# Patient Record
Sex: Female | Born: 1975 | Race: White | Hispanic: No | Marital: Married | Smoking: Current every day smoker
Health system: Southern US, Community
[De-identification: ages and names within clinical notes are randomized; demographics above are authoritative.]

## PROBLEM LIST (undated history)

## (undated) DIAGNOSIS — Z789 Other specified health status: Secondary | ICD-10-CM

## (undated) DIAGNOSIS — L02429 Furuncle of limb, unspecified: Secondary | ICD-10-CM

## (undated) HISTORY — PX: THYROID LOBECTOMY: SHX420

---

## 2015-12-16 ENCOUNTER — Ambulatory Visit: Payer: Self-pay | Admitting: Physician Assistant

## 2015-12-19 NOTE — Pre-Procedure Instructions (Signed)
    [name]  12/19/2015    Your procedure is scheduled on Thursday May 11.  [facility]  at 1:00 PM .                 .Your surgery or procedure is scheduled for 3:00 PM   Call this number if you have problems the morning of [phone]                For any other questions, please call [phone], Monday - Friday 8 AM - 4 PM.   Remember:  Do not eat food or drink liquids after midnight Wednesday, May 10.  Take these medicines the morning of surgery with A SIP OF WATER : Take if needed: Tramadol.             STOP taking Meloxicam.    Do not wear jewelry, make-up or nail polish.  Do not wear lotions, powders, or perfumes.   Do not shave 48 hours prior to surgery.    Do not bring valuables to the hospital.  [facility] is not responsible for any belongings or valuables.  Contacts, dentures or bridgework may not be worn into surgery.  Leave your suitcase in the car.  After surgery it may be brought to your room.  For patients admitted to the hospital, discharge time will be determined by your treatment team.     Please read over the following fact sheets that you were given.:  Review  Alexandra Reese - Preparing For Surgery.

## 2015-12-20 ENCOUNTER — Encounter (HOSPITAL_COMMUNITY): Payer: Self-pay

## 2015-12-20 ENCOUNTER — Encounter (HOSPITAL_COMMUNITY)
Admission: RE | Admit: 2015-12-20 | Discharge: 2015-12-20 | Disposition: A | Discharge status: Home / Self Care | Payer: Managed Care, Other (non HMO) | Source: Ambulatory Visit | Attending: Orthopedic Surgery | Admitting: Orthopedic Surgery

## 2015-12-20 DIAGNOSIS — Z79899 Other long term (current) drug therapy: Secondary | ICD-10-CM | POA: Diagnosis not present

## 2015-12-20 DIAGNOSIS — Z01812 Encounter for preprocedural laboratory examination: Secondary | ICD-10-CM | POA: Insufficient documentation

## 2015-12-20 DIAGNOSIS — Z01818 Encounter for other preprocedural examination: Secondary | ICD-10-CM | POA: Diagnosis not present

## 2015-12-20 DIAGNOSIS — Z87891 Personal history of nicotine dependence: Secondary | ICD-10-CM | POA: Diagnosis not present

## 2015-12-20 DIAGNOSIS — M5021 Other cervical disc displacement,  high cervical region: Secondary | ICD-10-CM | POA: Diagnosis not present

## 2015-12-20 HISTORY — DX: Other specified health status: Z78.9

## 2015-12-20 HISTORY — DX: Furuncle of limb, unspecified: L02.429

## 2015-12-20 LAB — CBC
HCT: 42.4 % (ref 36.0–46.0)
HEMOGLOBIN: 13.7 g/dL (ref 12.0–15.0)
MCH: 32 pg (ref 26.0–34.0)
MCHC: 32.3 g/dL (ref 30.0–36.0)
MCV: 99.1 fL (ref 78.0–100.0)
Platelets: 274 10*3/uL (ref 150–400)
RBC: 4.28 MIL/uL (ref 3.87–5.11)
RDW: 12.8 % (ref 11.5–15.5)
WBC: 7.9 10*3/uL (ref 4.0–10.5)

## 2015-12-20 LAB — SURGICAL PCR SCREEN
MRSA, PCR: NEGATIVE
Staphylococcus aureus: NEGATIVE

## 2015-12-20 LAB — HCG, SERUM, QUALITATIVE: Preg, Serum: NEGATIVE

## 2015-12-20 NOTE — Progress Notes (Signed)
Mrs Alexandra Reese denies chest pain or shortness of breath.  PCP [place]. "I saw him for cearance per Dr Shon BatonBrooks request."

## 2015-12-20 NOTE — Progress Notes (Signed)
Anesthesia Chart Review: Patient is a 40 year old female scheduled for total disc replacement arthroplasty C3-4 on 12/22/15 by Dr. Shon BatonBrooks. On 08/23/15 a heavy box fell onto her back while a work.  History includes smoking, thyroid lobectomy (side not specified), RLE boil (MRSA). PCP is reportedly Dr. Raquel SarnaAndy White in [place]. Neurologist is Dr. Ellwood Denseaniel Collins with [facility].  Meds include Flexeril, Mobic, tramadol.  PAT Vitals: BP 111/75, HR 69, RR 20, T 36.7C, O2 sat 97%. BMI 22.71.  Preoperative labs noted.   If no acute changes then I anticipate that she can proceed as planned.  Velna Ochsllison Raquelle Pietro, PA-C [facility]/Anesthesiology Phone [phone] 12/20/2015 1:12 PM

## 2015-12-22 ENCOUNTER — Encounter (HOSPITAL_COMMUNITY): Payer: Self-pay | Admitting: Surgery

## 2015-12-22 ENCOUNTER — Encounter (HOSPITAL_COMMUNITY)
Admission: RE | Disposition: A | Discharge status: Home / Self Care | Payer: Self-pay | Source: Ambulatory Visit | Attending: Orthopedic Surgery

## 2015-12-22 ENCOUNTER — Ambulatory Visit (HOSPITAL_COMMUNITY): Payer: Worker's Compensation | Admitting: Certified Registered"

## 2015-12-22 ENCOUNTER — Observation Stay (HOSPITAL_COMMUNITY)
Admission: RE | Admit: 2015-12-22 | Discharge: 2015-12-23 | Disposition: A | Discharge status: Home / Self Care | Payer: Worker's Compensation | Source: Ambulatory Visit | Attending: Orthopedic Surgery | Admitting: Orthopedic Surgery

## 2015-12-22 ENCOUNTER — Ambulatory Visit (HOSPITAL_COMMUNITY): Payer: Worker's Compensation | Admitting: Vascular Surgery

## 2015-12-22 ENCOUNTER — Ambulatory Visit (HOSPITAL_COMMUNITY): Payer: Worker's Compensation

## 2015-12-22 ENCOUNTER — Observation Stay (HOSPITAL_COMMUNITY): Payer: Worker's Compensation

## 2015-12-22 DIAGNOSIS — M5011 Cervical disc disorder with radiculopathy,  high cervical region: Secondary | ICD-10-CM | POA: Diagnosis not present

## 2015-12-22 DIAGNOSIS — M542 Cervicalgia: Secondary | ICD-10-CM | POA: Diagnosis present

## 2015-12-22 DIAGNOSIS — F17210 Nicotine dependence, cigarettes, uncomplicated: Secondary | ICD-10-CM | POA: Diagnosis not present

## 2015-12-22 DIAGNOSIS — Z419 Encounter for procedure for purposes other than remedying health state, unspecified: Secondary | ICD-10-CM

## 2015-12-22 DIAGNOSIS — Z981 Arthrodesis status: Secondary | ICD-10-CM

## 2015-12-22 HISTORY — PX: CERVICAL DISC ARTHROPLASTY: SHX587

## 2015-12-22 SURGERY — CERVICAL ANTERIOR DISC ARTHROPLASTY
Anesthesia: General | Site: Spine Cervical

## 2015-12-22 MED ORDER — THROMBIN 20000 UNITS EX SOLR
CUTANEOUS | Status: AC
  Filled 2015-12-22: qty 20000

## 2015-12-22 MED ORDER — ACETAMINOPHEN 10 MG/ML IV SOLN
INTRAVENOUS | Status: AC
Start: 2015-12-22 — End: 2015-12-22
  Filled 2015-12-22: qty 100

## 2015-12-22 MED ORDER — ONDANSETRON HCL 4 MG/2ML IJ SOLN
4.0000 mg | INTRAMUSCULAR | Status: DC | PRN
Start: 2015-12-22 — End: 2015-12-23
  Administered 2015-12-22: 4 mg via INTRAVENOUS
  Filled 2015-12-22: qty 2

## 2015-12-22 MED ORDER — METHOCARBAMOL 500 MG PO TABS
500.0000 mg | ORAL_TABLET | Freq: Four times a day (QID) | ORAL | Status: DC | PRN
Start: 2015-12-22 — End: 2015-12-23

## 2015-12-22 MED ORDER — CEFAZOLIN SODIUM 1-5 GM-% IV SOLN
1.0000 g | Freq: Three times a day (TID) | INTRAVENOUS | Status: AC
  Administered 2015-12-22 – 2015-12-23 (×2): 1 g via INTRAVENOUS
  Filled 2015-12-22 (×2): qty 50

## 2015-12-22 MED ORDER — PROPOFOL 10 MG/ML IV BOLUS
INTRAVENOUS | Status: AC
  Filled 2015-12-22: qty 20

## 2015-12-22 MED ORDER — HEMOSTATIC AGENTS (NO CHARGE) OPTIME
TOPICAL | Status: DC | PRN
  Administered 2015-12-22: 4 via TOPICAL

## 2015-12-22 MED ORDER — THROMBIN 20000 UNITS EX SOLR: CUTANEOUS | Status: DC | PRN

## 2015-12-22 MED ORDER — PHENOL 1.4 % MT LIQD: 1.0000 | OROMUCOSAL | Status: DC | PRN

## 2015-12-22 MED ORDER — ACETAMINOPHEN 10 MG/ML IV SOLN
INTRAVENOUS | Status: DC | PRN
  Administered 2015-12-22: 1000 mg via INTRAVENOUS

## 2015-12-22 MED ORDER — ONDANSETRON HCL 4 MG PO TABS: 4.0000 mg | ORAL_TABLET | Freq: Three times a day (TID) | ORAL | Status: AC | PRN

## 2015-12-22 MED ORDER — METHOCARBAMOL 500 MG PO TABS: 500.0000 mg | ORAL_TABLET | Freq: Three times a day (TID) | ORAL | Status: AC | PRN

## 2015-12-22 MED ORDER — FENTANYL CITRATE (PF) 250 MCG/5ML IJ SOLN
INTRAMUSCULAR | Status: AC
  Filled 2015-12-22: qty 5

## 2015-12-22 MED ORDER — PROCHLORPERAZINE EDISYLATE 5 MG/ML IJ SOLN: 10.0000 mg | Freq: Once | INTRAMUSCULAR | Status: DC | PRN

## 2015-12-22 MED ORDER — DEXAMETHASONE SODIUM PHOSPHATE 4 MG/ML IJ SOLN
4.0000 mg | Freq: Four times a day (QID) | INTRAMUSCULAR | Status: DC
  Administered 2015-12-22 – 2015-12-23 (×3): 4 mg via INTRAVENOUS
  Filled 2015-12-22 (×2): qty 1

## 2015-12-22 MED ORDER — MIDAZOLAM HCL 5 MG/5ML IJ SOLN
INTRAMUSCULAR | Status: DC | PRN
  Administered 2015-12-22: 2 mg via INTRAVENOUS

## 2015-12-22 MED ORDER — DOCUSATE SODIUM 100 MG PO CAPS: 100.0000 mg | ORAL_CAPSULE | Freq: Three times a day (TID) | ORAL | Status: AC | PRN

## 2015-12-22 MED ORDER — OXYCODONE HCL 5 MG PO TABS: 10.0000 mg | ORAL_TABLET | ORAL | Status: DC | PRN

## 2015-12-22 MED ORDER — EPHEDRINE SULFATE 50 MG/ML IJ SOLN
INTRAMUSCULAR | Status: DC | PRN
  Administered 2015-12-22 (×4): 10 mg via INTRAVENOUS

## 2015-12-22 MED ORDER — LIDOCAINE HCL (CARDIAC) 20 MG/ML IV SOLN
INTRAVENOUS | Status: DC | PRN
  Administered 2015-12-22: 100 mg via INTRAVENOUS

## 2015-12-22 MED ORDER — SODIUM CHLORIDE 0.9% FLUSH: 3.0000 mL | INTRAVENOUS | Status: DC | PRN

## 2015-12-22 MED ORDER — PHENYLEPHRINE HCL 10 MG/ML IJ SOLN
INTRAMUSCULAR | Status: DC | PRN
  Administered 2015-12-22 (×3): 80 ug via INTRAVENOUS
  Administered 2015-12-22: 160 ug via INTRAVENOUS

## 2015-12-22 MED ORDER — BUPIVACAINE-EPINEPHRINE (PF) 0.25% -1:200000 IJ SOLN
INTRAMUSCULAR | Status: AC
  Filled 2015-12-22: qty 30

## 2015-12-22 MED ORDER — MORPHINE SULFATE (PF) 2 MG/ML IV SOLN: 1.0000 mg | INTRAVENOUS | Status: DC | PRN

## 2015-12-22 MED ORDER — THROMBIN 20000 UNITS EX SOLR
CUTANEOUS | Status: DC | PRN
  Administered 2015-12-22: 17:00:00 via TOPICAL

## 2015-12-22 MED ORDER — CEFAZOLIN SODIUM-DEXTROSE 2-4 GM/100ML-% IV SOLN
2.0000 g | INTRAVENOUS | Status: AC
  Administered 2015-12-22: 2 g via INTRAVENOUS

## 2015-12-22 MED ORDER — DEXAMETHASONE SODIUM PHOSPHATE 10 MG/ML IJ SOLN
INTRAMUSCULAR | Status: DC | PRN
  Administered 2015-12-22: 10 mg via INTRAVENOUS

## 2015-12-22 MED ORDER — ONDANSETRON HCL 4 MG/2ML IJ SOLN
INTRAMUSCULAR | Status: DC | PRN
  Administered 2015-12-22: 4 mg via INTRAVENOUS

## 2015-12-22 MED ORDER — LACTATED RINGERS IV SOLN
INTRAVENOUS | Status: DC
  Administered 2015-12-22: 21:00:00 via INTRAVENOUS

## 2015-12-22 MED ORDER — BUPIVACAINE-EPINEPHRINE 0.25% -1:200000 IJ SOLN
INTRAMUSCULAR | Status: DC | PRN
  Administered 2015-12-22: 10 mL

## 2015-12-22 MED ORDER — ROCURONIUM BROMIDE 100 MG/10ML IV SOLN
INTRAVENOUS | Status: DC | PRN
  Administered 2015-12-22: 10 mg via INTRAVENOUS
  Administered 2015-12-22: 40 mg via INTRAVENOUS

## 2015-12-22 MED ORDER — PROPOFOL 10 MG/ML IV BOLUS
INTRAVENOUS | Status: DC | PRN
  Administered 2015-12-22: 140 mg via INTRAVENOUS
  Administered 2015-12-22: 20 mg via INTRAVENOUS

## 2015-12-22 MED ORDER — SODIUM CHLORIDE 0.9 % IV SOLN: 250.0000 mL | INTRAVENOUS | Status: DC

## 2015-12-22 MED ORDER — FENTANYL CITRATE (PF) 100 MCG/2ML IJ SOLN: 25.0000 ug | INTRAMUSCULAR | Status: DC | PRN

## 2015-12-22 MED ORDER — LIDOCAINE 2% (20 MG/ML) 5 ML SYRINGE
INTRAMUSCULAR | Status: AC
  Filled 2015-12-22: qty 5

## 2015-12-22 MED ORDER — LACTATED RINGERS IV SOLN
INTRAVENOUS | Status: DC
  Administered 2015-12-22 (×2): via INTRAVENOUS

## 2015-12-22 MED ORDER — CEFAZOLIN SODIUM-DEXTROSE 2-4 GM/100ML-% IV SOLN
INTRAVENOUS | Status: AC
  Filled 2015-12-22: qty 100

## 2015-12-22 MED ORDER — PHENYLEPHRINE HCL 10 MG/ML IJ SOLN
10.0000 mg | INTRAMUSCULAR | Status: DC | PRN
  Administered 2015-12-22: 20 ug/min via INTRAVENOUS

## 2015-12-22 MED ORDER — SODIUM CHLORIDE 0.9% FLUSH: 3.0000 mL | Freq: Two times a day (BID) | INTRAVENOUS | Status: DC

## 2015-12-22 MED ORDER — DEXAMETHASONE 4 MG PO TABS: 4.0000 mg | ORAL_TABLET | Freq: Four times a day (QID) | ORAL | Status: DC

## 2015-12-22 MED ORDER — SUGAMMADEX SODIUM 200 MG/2ML IV SOLN
INTRAVENOUS | Status: DC | PRN
  Administered 2015-12-22: 200 mg via INTRAVENOUS

## 2015-12-22 MED ORDER — HYDROMORPHONE HCL 1 MG/ML IJ SOLN
INTRAMUSCULAR | Status: AC
  Filled 2015-12-22: qty 1

## 2015-12-22 MED ORDER — HYDROMORPHONE HCL 1 MG/ML IJ SOLN
0.2500 mg | INTRAMUSCULAR | Status: DC | PRN
  Administered 2015-12-22 (×2): 0.25 mg via INTRAVENOUS

## 2015-12-22 MED ORDER — MIDAZOLAM HCL 2 MG/2ML IJ SOLN
INTRAMUSCULAR | Status: AC
Start: 2015-12-22 — End: 2015-12-22
  Filled 2015-12-22: qty 2

## 2015-12-22 MED ORDER — FENTANYL CITRATE (PF) 100 MCG/2ML IJ SOLN
INTRAMUSCULAR | Status: DC | PRN
  Administered 2015-12-22 (×7): 50 ug via INTRAVENOUS

## 2015-12-22 MED ORDER — OXYCODONE-ACETAMINOPHEN 10-325 MG PO TABS: 1.0000 | ORAL_TABLET | ORAL | Status: AC | PRN

## 2015-12-22 MED ORDER — METHOCARBAMOL 1000 MG/10ML IJ SOLN: 500.0000 mg | Freq: Four times a day (QID) | INTRAVENOUS | Status: DC | PRN

## 2015-12-22 MED ORDER — 0.9 % SODIUM CHLORIDE (POUR BTL) OPTIME
TOPICAL | Status: DC | PRN
  Administered 2015-12-22: 1000 mL

## 2015-12-22 MED ORDER — ROCURONIUM BROMIDE 50 MG/5ML IV SOLN
INTRAVENOUS | Status: AC
  Filled 2015-12-22: qty 1

## 2015-12-22 MED ORDER — MENTHOL 3 MG MT LOZG: 1.0000 | LOZENGE | OROMUCOSAL | Status: DC | PRN

## 2015-12-22 SURGICAL SUPPLY — 62 items
BIT MILLING PRODISC 2.0 STER (BIT) ×6 IMPLANT
BLADE SURG ROTATE 9660 (MISCELLANEOUS) IMPLANT
BUR EGG ELITE 4.0 (BURR) IMPLANT
BUR EGG ELITE 4.0MM (BURR)
BUR MATCHSTICK NEURO 3.0 LAGG (BURR) IMPLANT
CANISTER SUCTION 2500CC (MISCELLANEOUS) ×3 IMPLANT
CLOSURE STERI-STRIP 1/2X4 (GAUZE/BANDAGES/DRESSINGS) ×1
CLSR STERI-STRIP ANTIMIC 1/2X4 (GAUZE/BANDAGES/DRESSINGS) ×2 IMPLANT
COVER MAYO STAND STRL (DRAPES) ×12 IMPLANT
COVER SURGICAL LIGHT HANDLE (MISCELLANEOUS) ×6 IMPLANT
CRADLE DONUT ADULT HEAD (MISCELLANEOUS) ×3 IMPLANT
DRAPE C-ARM 42X72 X-RAY (DRAPES) ×3 IMPLANT
DRAPE C-ARMOR (DRAPES) ×3 IMPLANT
DRAPE POUCH INSTRU U-SHP 10X18 (DRAPES) ×3 IMPLANT
DRAPE SURG 17X23 STRL (DRAPES) ×3 IMPLANT
DRAPE U-SHAPE 47X51 STRL (DRAPES) ×3 IMPLANT
DRSG AQUACEL AG ADV 3.5X 4 (GAUZE/BANDAGES/DRESSINGS) ×3 IMPLANT
DRSG MEPILEX BORDER 4X4 (GAUZE/BANDAGES/DRESSINGS) ×3 IMPLANT
DURAPREP 6ML APPLICATOR 50/CS (WOUND CARE) ×3 IMPLANT
ELECT COATED BLADE 2.86 ST (ELECTRODE) ×3 IMPLANT
ELECT PENCIL ROCKER SW 15FT (MISCELLANEOUS) ×3 IMPLANT
ELECT REM PT RETURN 9FT ADLT (ELECTROSURGICAL) ×3
ELECTRODE REM PT RTRN 9FT ADLT (ELECTROSURGICAL) ×1 IMPLANT
GLOVE BIOGEL PI IND STRL 6.5 (GLOVE) ×1 IMPLANT
GLOVE BIOGEL PI IND STRL 8.5 (GLOVE) ×1 IMPLANT
GLOVE BIOGEL PI INDICATOR 6.5 (GLOVE) ×2
GLOVE BIOGEL PI INDICATOR 8.5 (GLOVE) ×2
GLOVE SS BIOGEL STRL SZ 8.5 (GLOVE) ×1 IMPLANT
GLOVE SUPERSENSE BIOGEL SZ 8.5 (GLOVE) ×2
GLOVE SURG SS PI 6.5 STRL IVOR (GLOVE) ×3 IMPLANT
GOWN STRL REUS W/ TWL LRG LVL3 (GOWN DISPOSABLE) ×1 IMPLANT
GOWN STRL REUS W/TWL 2XL LVL3 (GOWN DISPOSABLE) ×6 IMPLANT
GOWN STRL REUS W/TWL LRG LVL3 (GOWN DISPOSABLE) ×2
Inserter Tip F/Medium & Medium Deep Implants 5MM H ×3 IMPLANT
KIT BASIN OR (CUSTOM PROCEDURE TRAY) ×3 IMPLANT
KIT ROOM TURNOVER OR (KITS) ×3 IMPLANT
NEEDLE 22X1 1/2 (OR ONLY) (NEEDLE) ×3 IMPLANT
NEEDLE SPNL 18GX3.5 QUINCKE PK (NEEDLE) ×3 IMPLANT
NS IRRIG 1000ML POUR BTL (IV SOLUTION) ×3 IMPLANT
PACK ORTHO CERVICAL (CUSTOM PROCEDURE TRAY) ×3 IMPLANT
PACK UNIVERSAL I (CUSTOM PROCEDURE TRAY) ×3 IMPLANT
PAD ARMBOARD 7.5X6 YLW CONV (MISCELLANEOUS) ×6 IMPLANT
PRODISC C SCREW 12MM ×6 IMPLANT
RESTRAINT LIMB HOLDER UNIV (RESTRAINTS) ×3 IMPLANT
SCREW RETAINER 12 (Screw) ×6 IMPLANT
SPONGE INTESTINAL PEANUT (DISPOSABLE) IMPLANT
SPONGE SURGIFOAM ABS GEL 100 (HEMOSTASIS) ×3 IMPLANT
SURGIFLO W/THROMBIN 8M KIT (HEMOSTASIS) ×3 IMPLANT
SUT BONE WAX W31G (SUTURE) ×3 IMPLANT
SUT MNCRL AB 3-0 PS2 27 (SUTURE) ×3 IMPLANT
SUT SILK 2 0 TIES 10X30 (SUTURE) ×3 IMPLANT
SUT SILK 3 0 TIES 17X18 (SUTURE) ×2
SUT SILK 3-0 18XBRD TIE BLK (SUTURE) ×1 IMPLANT
SUT VIC AB 2-0 CT1 18 (SUTURE) ×3 IMPLANT
SYR BULB IRRIGATION 50ML (SYRINGE) ×3 IMPLANT
SYR CONTROL 10ML LL (SYRINGE) ×3 IMPLANT
TAPE CLOTH 4X10 WHT NS (GAUZE/BANDAGES/DRESSINGS) ×3 IMPLANT
TAPE UMBILICAL COTTON 1/8X30 (MISCELLANEOUS) ×3 IMPLANT
TIP INSERTER MEDIUM (INSTRUMENTS) ×3 IMPLANT
TOWEL OR 17X24 6PK STRL BLUE (TOWEL DISPOSABLE) ×3 IMPLANT
TOWEL OR 17X26 10 PK STRL BLUE (TOWEL DISPOSABLE) ×3 IMPLANT
WATER STERILE IRR 1000ML POUR (IV SOLUTION) ×3 IMPLANT

## 2015-12-22 NOTE — Discharge Instructions (Signed)

## 2015-12-22 NOTE — Anesthesia Procedure Notes (Signed)
Procedure Name: Intubation Date/Time: 12/22/2015 4:35 PM Performed by: Rosiland OzMEYERS, Alexandra Devito Pre-anesthesia Checklist: Patient identified, Timeout performed, Emergency Drugs available, Suction available and Patient being monitored Patient Re-evaluated:Patient Re-evaluated prior to inductionOxygen Delivery Method: Circle system utilized Preoxygenation: Pre-oxygenation with 100% oxygen Intubation Type: IV induction Ventilation: Mask ventilation without difficulty Laryngoscope Size: Miller and 2 Grade View: Grade II Tube type: Oral Tube size: 7.0 mm Number of attempts: 1 Airway Equipment and Method: Stylet Placement Confirmation: ETT inserted through vocal cords under direct vision,  positive ETCO2 and breath sounds checked- equal and bilateral Secured at: 21 cm Tube secured with: Tape Dental Injury: Teeth and Oropharynx as per pre-operative assessment

## 2015-12-22 NOTE — Transfer of Care (Signed)
Immediate Anesthesia Transfer of Care Note  Patient: Alexandra Reese  Procedure(s) Performed: Procedure(s): TOTAL DISC REPLACEMENT ARTHROPLASTY C3-4 (N/A)  Patient Location: PACU  Anesthesia Type:General  Level of Consciousness: oriented, sedated, patient cooperative and responds to stimulation  Airway & Oxygen Therapy: Patient Spontanous Breathing and Patient connected to nasal cannula oxygen  Post-op Assessment: Report given to RN, Post -op Vital signs reviewed and stable and Patient moving all extremities X 4  Post vital signs: Reviewed and stable  Last Vitals:  Filed Vitals:   12/22/15 1307  BP: 125/79  Pulse: 70  Temp: 36.8 C    Last Pain:  Filed Vitals:   12/22/15 1308  PainSc: 5          Complications: No apparent anesthesia complications

## 2015-12-22 NOTE — Anesthesia Postprocedure Evaluation (Signed)
Anesthesia Post Note  Patient: Alexandra Reese  Procedure(s) Performed: Procedure(s) (LRB): TOTAL DISC REPLACEMENT ARTHROPLASTY C3-4 (N/A)  Patient location during evaluation: PACU Anesthesia Type: General Level of consciousness: awake and alert Pain management: pain level controlled Vital Signs Assessment: post-procedure vital signs reviewed and stable Respiratory status: spontaneous breathing, nonlabored ventilation, respiratory function stable and patient connected to nasal cannula oxygen Cardiovascular status: blood pressure returned to baseline and stable Postop Assessment: no signs of nausea or vomiting Anesthetic complications: no    Last Vitals:  Filed Vitals:   12/22/15 1957 12/22/15 2015  BP:  120/63  Pulse: 83 83  Temp: 36.7 C 36.6 C  Resp: 14 16    Last Pain:  Filed Vitals:   12/22/15 2059  PainSc: 4                  Claudeen Leason DAVID

## 2015-12-22 NOTE — Brief Op Note (Signed)
12/22/2015  6:48 PM  PATIENT:  Alexandra Reese  40 y.o. female  PRE-OPERATIVE DIAGNOSIS:  HNP C3-4  POST-OPERATIVE DIAGNOSIS:  HNP C3-4  PROCEDURE:  Procedure(s): TOTAL DISC REPLACEMENT ARTHROPLASTY C3-4 (N/A)  SURGEON:  Surgeon(s) and Role:    * Venita Lickahari Bennett Ram, MD - Primary  PHYSICIAN ASSISTANT:   ASSISTANTS: Carmen Mayo   ANESTHESIA:   general  EBL:  Total I/O In: 1000 [I.V.:1000] Out: -   BLOOD ADMINISTERED:none  DRAINS: none   LOCAL MEDICATIONS USED:  MARCAINE     SPECIMEN:  No Specimen  DISPOSITION OF SPECIMEN:  N/A  COUNTS:  YES  TOURNIQUET:  * No tourniquets in log *  DICTATION: .Other Dictation: Dictation Number [id number]  PLAN OF CARE: Admit for overnight observation  PATIENT DISPOSITION:  PACU - hemodynamically stable.

## 2015-12-22 NOTE — H&P (Signed)
History of Present Illness The patient is a 40 year old female who presents with neck pain. The patient is seen today pt returns to discuss hr Cervical MRI that was done at [facility]. which began 3 month(s) (and 3 weeks) ago. 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 I had pain when I picked the box up and put it on a shelf"). Symptoms include neck pain (across the trapezius, bilaterally and the posterior neck), muscle spasm ("Anytime I use my left arm, my trapezius swells up and my phys therapist has to get it to release"), shoulder pain (left shoulder), tingling (left scapula) and upper extremity weakness (left arm), while symptoms do not include neck stiffness, crepitus, tenderness, impaired range of motion, numbness (left scapula), arm numbness, weakness or headaches. The patient describes the pain as sharp, dull, aching, burning, stinging and throbbing.The patient describes their symptoms as mild (she rates her pain at 4/10 right now).The patient does feel that the symptoms are unchanged. Symptoms are exacerbated by turning the head to the left, use of the left arm and neck extension, while symptoms are not exacerbated by turning the head to the right, use of the right arm or neck flexion. Associated symptoms include headache and upper extremity weakness. Current treatment includes muscle relaxants (Flexeril). Pertinent medical history does not include neck pain, spinal surgery, low back pain, cervical disc herniation, neck injury or cervical spondylosis. The patient is currently able to work with limitations (per NCM "she has been out of work since her last visit here b/c they could not accomodate the light duty resteictions"). Prior to being seen today the patient was previously evaluated in this clinic. Past evaluation has included cervical spine MRI  [facility] in [place]). Past treatment has included physical therapy. 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  Additional reasons for visit:  Transition into care is described as the following: The patient is transitioning into care and a summary of care was reviewed.    Subjective Transcription She returns today for followup. She has had the new MRI. On the MRI today, the C4-5 level shows some mild disc disease. No canal or foraminal stenosis is noted. Moderate disc disease at C5-6, but no significant compression of the exiting nerve roots. No cord signal change. Again, we see the large left disc herniation at C3-4 with moderate mass effect on the cord as well as the compressing the C4 nerve root.  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 Chronic Obstructive Lung Disease  Paternal Grandmother. Congestive Heart Failure  Paternal Grandmother. Diabetes Mellitus  Father, Paternal Grandmother. Heart Disease  Maternal Grandfather. Hypertension  Father, Maternal Grandfather, Paternal Grandmother. Osteoarthritis  Maternal Grandfather, Paternal Grandmother.  Social History Tobacco / smoke exposure  12/06/2015: yes Tobacco use  Current every day smoker. 12/06/2015: smoke(d) 1 pack(s) per day Children  2 Current work status  working full time Exercise  Exercises rarely; does running / walking Living situation  live with spouse Marital status  married Never consumed alcohol  12/06/2015: Never consumed alcohol No history of drug/alcohol  rehab  Not under pain contract  Number of flights of stairs before winded  1  Medication History  TraMADol HCl (50MG  Tablet, 1 (one) Tablet Oral TID  PRN, Taken starting 12/06/2015) Active. (RX GIVEN AT VISIT PER DDB/SMT 12/06/15) Meloxicam (7.5MG  Tablet, 1 (one) Tablet Oral BID PC, Taken starting 12/06/2015) Active. (RX GIVEN AT VISIT PER DDB/SMT 12/06/15) Gabapentin (100MG  Capsule, Oral) Active. (300mg  qhs) Cyclobenzaprine HCl (10MG  Tablet, Oral) Active. (prn) Medications Reconciled  Objective Transcription  Negative Babinski test. Normal gait pattern. Significant neck and left trapezial pain as well as scapular pain. No focal or motor deficits in the upper extremity. Bicep, triceps, wrist extensor, and grip strength is intact. Sensation in the C4 distribution is altered over the trapezius and there is severe pain in that distribution. No SOB/CP.  Abd soft/NT. Lungs CTA. Heart: RRR, no rubs/gallops/murmurs. Normal gait pattern.  Intact peripheral pulses throughout. Compartments soft/NT  Assessment & Plan  Goal Of Surgery: Discussed that goal of surgery is to reduce pain and improve function and quality of life. Patient is aware that despite all appropriate treatment that there pain and function could be the same, worse, or different.  Anterior cervical fusion:Risks of surgery include, but are not limited to: Throat pain, swallowing difficulty, hoarseness or change in voice, death, stroke, paralysis, nerve root damage/injury, bleeding, blood clots, loss of bowel/bladder control, hardware failure, or mal-position, spinal fluid leak, adjacent segment disease, non-union, need for further surgery, ongoing or worse pain, infection. Post-operative bleeding or swelling that could require emergent surgery. Unable to perform disc replacement and need to perform a fusion  Plans Transcription At this point in time, the MRI also shows mild facet arthrosis at C3-4. At this point, I think since there is no severe foraminal stenosis due to facet overgrowth and it is only mild? moderate facet arthrosis, I think she is a candidate for disc replacement. I  have gone over the risks and benefits of the procedure with her. All of her questions were addressed. She is elected to proceed with surgery, which given the duration of her symptoms in the corresponding MRI, I think is reasonable. We will go ahead and set this up. No change to her current restrictions.

## 2015-12-22 NOTE — Anesthesia Preprocedure Evaluation (Addendum)
Anesthesia Evaluation  Patient identified by MRN, date of birth, ID band Patient awake    Reviewed: Allergy & Precautions, NPO status , Patient's Chart, lab work & pertinent test results  Airway Mallampati: II  TM Distance: >3 FB Neck ROM: Full    Dental  (+) Upper Dentures, Lower Dentures   Pulmonary Current Smoker,    Pulmonary exam normal breath sounds clear to auscultation       Cardiovascular negative cardio ROS Normal cardiovascular exam Rhythm:Regular Rate:Normal     Neuro/Psych negative neurological ROS  negative psych ROS   GI/Hepatic negative GI ROS, Neg liver ROS,   Endo/Other  negative endocrine ROS  Renal/GU negative Renal ROS  negative genitourinary   Musculoskeletal negative musculoskeletal ROS (+)   Abdominal   Peds negative pediatric ROS (+)  Hematology negative hematology ROS (+)   Anesthesia Other Findings   Reproductive/Obstetrics negative OB ROS                            Anesthesia Physical Anesthesia Plan  ASA: II  Anesthesia Plan: General   Post-op Pain Management:    Induction: Intravenous  Airway Management Planned: Oral ETT  Additional Equipment:   Intra-op Plan:   Post-operative Plan: Extubation in OR  Informed Consent: I have reviewed the patients History and Physical, chart, labs and discussed the procedure including the risks, benefits and alternatives for the proposed anesthesia with the patient or authorized representative who has indicated his/her understanding and acceptance.   Dental advisory given  Plan Discussed with: CRNA  Anesthesia Plan Comments:         Anesthesia Quick Evaluation

## 2015-12-22 NOTE — Progress Notes (Signed)
Patient arrived to 5C04 from PACU. Patient alert and oriented x 4. Has on soft cervical collar. Reports minimal pain rated 4/10.  Bedside report received from PACU nurse.

## 2015-12-23 ENCOUNTER — Encounter (HOSPITAL_COMMUNITY): Payer: Self-pay | Admitting: Orthopedic Surgery

## 2015-12-23 NOTE — Progress Notes (Signed)
Discharge instructions reviewed with patient and spouse.

## 2015-12-23 NOTE — Care Management Note (Signed)
Case Management Note  Patient Details  Name: Alexandra Reese MRN: [id number] Date of Birth: June 16, 1976  Subjective/Objective:                    Action/Plan: Pt discharging home with self care. No further needs per CM.   Expected Discharge Date:                  Expected Discharge Plan:  Home/Self Care  In-House Referral:     Discharge planning Services     Post Acute Care Choice:    Choice offered to:     DME Arranged:    DME Agency:     HH Arranged:    HH Agency:     Status of Service:  Completed, signed off  Medicare Important Message Given:    Date Medicare IM Given:    Medicare IM give by:    Date Additional Medicare IM Given:    Additional Medicare Important Message give by:     If discussed at Long Length of Stay Meetings, dates discussed:    Additional Comments:  Kermit BaloKelli F Kaito Schulenburg, RN 12/23/2015, 10:31 AM

## 2015-12-23 NOTE — Progress Notes (Signed)
.      Subjective: Procedure(s) (LRB): TOTAL DISC REPLACEMENT ARTHROPLASTY C3-4 (N/A) 1 Day Post-Op  Patient reports pain as 2 on 0-10 scale.  Reports decreased arm pain reports incisional neck pain   Positive void Negative bowel movement Positive flatus Negative chest pain or shortness of breath  Objective: Vital signs in last 24 hours: Temp:  [97.8 F (36.6 C)-98.6 F (37 C)] 98.6 F (37 C) (05/12 0527) Pulse Rate:  [70-104] 83 (05/12 0527) Resp:  [14-19] 18 (05/12 0527) BP: (103-125)/(57-79) 103/57 mmHg (05/12 0527) SpO2:  [95 %-98 %] 95 % (05/12 0527) Weight:  [65.772 kg (145 lb)] 65.772 kg (145 lb) (05/11 2015)  Intake/Output from previous day: 05/11 0701 - 05/12 0700 In: 1800 [I.V.:1800] Out: 17 [Blood:17]  Labs:  Recent Labs  12/20/15 1040  WBC 7.9  RBC 4.28  HCT 42.4  PLT 274   No results for input(s): NA, K, CL, CO2, BUN, CREATININE, GLUCOSE, CALCIUM in the last 72 hours. No results for input(s): LABPT, INR in the last 72 hours.  Physical Exam: Neurologically intact ABD soft Intact pulses distally Incision: dressing C/D/I and no drainage Compartment soft  Assessment/Plan: Patient stable  xrays satisfactory Mobilization with physical therapy Encourage incentive spirometry Continue care  Advance diet Up with therapy  Plan on d/c today Doing well F/u 2 weeks   Venita Lickahari Kentrel Clevenger, MD [facility] [phone]

## 2015-12-23 NOTE — Op Note (Signed)
NAMEstelle Grumbles:  Alexandra Reese, Alexandra Reese              ACCOUNT NO.:  [id number]  MEDICAL RECORD NO.:  [id number]  LOCATION:  5C04C                        FACILITY:  [facility]  PHYSICIAN:  Tregan Read D. Shon BatonBrooks, M.D. 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  POSTOPERATIVE DIAGNOSIS:  Cervical disk herniation with radiculopathy, C3-4.  OPERATIVE PROCEDURE:  Cervical total disk replacement, C3-4.  IMPLANT USED:  Size 5 small DePuy ProDisc-C.  COMPLICATIONS:  None.  FIRST ASSISTANT:  Anette Riedelarmen Mayo, [place].  HISTORY:  This is a very pleasant 40 year old woman who presents with severe neck and neuropathic C4 radicular pain on the left side.  MRI confirmed a disk herniation with neural compression at C3-4.  After discussing treatment options and failing conservative management, we elected to proceed with surgery.  All appropriate risks, benefits, and alternatives were discussed with the patient, and consent was obtained.  OPERATIVE NOTE:  The patient was brought to the operating room, placed supine on the operating table.  After successful induction of general anesthesia and endotracheal intubation, TEDs and SCDs were applied.  An inflatable pressure bag was placed underneath the scapula, and the anterior cervical spine was prepped and draped in a standard fashion. The bag was inflated to restore cervical lordosis.  At this point, a time-out was done to confirm the patient, procedure, and all other pertinent important data.  Once this was done, I then used the lateral fluoro to identify the C3-4 level.  I marked this, infiltrated the incision site with 0.25% Marcaine.  Midline incision was made starting at the midline and proceeding to the left. Sharp dissection was carried out down to and through the platysma.  I performed a standard  Clementeen GrahamSmith- Robinson approach via transverse incision to the C3-4 level.  I sharply dissected along the medial border of the sternocleidomastoid, sweeping the esophagus and trachea to the right and identifying and protecting the carotid sheath laterally with my finger.  Once I was sharply down through the deep cervical and prevertebral fascia, I placed a retractor and then used Kittner dissectors to remove the remaining portion of the prevertebral fascia and expose the ALL.  A needle was placed into the C3- 4 disk space, and I took an x-ray to confirm that I was at the appropriate level.  Once this was confirmed, I then used bipolar electrocautery to mobilize the longus colli muscle from the midbody of 3 to the midbody of 4 bilaterally.  Self-retaining Caspar retractor blades were placed underneath the longus colli muscle.  The endotracheal cuff was deflated, and the retractors were expanded.  An annulotomy was performed with a 15-blade scalpel.  Then using pituitary rongeur, I removed the bulk of the disk material and used a 2- mm Kerrison punch to trim down the leading osteophyte from the inferior edge of C3.  I then placed distraction pins in the midline.  I confirmed satisfactory position of these pins in both the AP and lateral planes. Once I had the distraction pins in place, I distracted the intervertebral space with a lamina  spreader and then maintained the distraction with the pins.  I continued to work posteriorly using my curettes to remove the remaining disk.  Once I was down to the posterior annulus, I then used a fine nerve hook to sweep centrally into the left to remove 2 fragments of disk fragments.  I then used my nerve hook to develop a plane between the PLL and the dura and then used my 1 mm Kerrison to resect the PLL. At this point, I trimmed down any other osteophytes that were at the inferior aspect of the body.  The thecal sac was no longer indented upon.  At this  point, I was actually pleased with the diskectomy.  I irrigated copiously with normal saline and made sure I had removed all the cartilaginous end plate.  I made sure hemostasis using bipolar electrocautery and FloSeal.  I then trialed and elected to use the small size 5 as this provided the best positioning. Once I was at the appropriate depth, I secured it and then used the high- speed bur to create the thin cuts.  I then cleaned out the thin cuts, irrigated, and then inserted the device.  I had satisfactory position in both the AP and lateral planes.  I did take the neck through a gentle range of motion under live fluoro and it was satisfactory.  Distraction pins were removed.  The holes were plugged with bone wax. Meticulous hemostasis was obtained.  I irrigated copiously.  I returned the trachea and esophagus to midline.  I then closed the platysma with interrupted 2-0 Vicryl suture and the skin with 3-0 Monocryl.  Steri- Strips and dry dressing were applied, and the patient was extubated, transferred to PACU without incident.  At the end of the case, all needle and sponge counts were correct.  There were no adverse intraoperative events.     Vuong Musa D. Shon Baton, M.D.     DDB/MEDQ  D:  12/22/2015  T:  12/23/2015  Job:  [id number]  cc:   Debria Garret D. Shon Baton, M.D.

## 2015-12-23 NOTE — Progress Notes (Signed)
OT Cancellation Note  Patient Details Name: Alexandra Reese MRN: [id number] DOB: May 13, 1976   Cancelled Treatment:    Reason Eval/Treat Not Completed: Other (comment) (Pt D/C home before OT could complete evaluation and recommendations).   Edwin CapPatricia Cathy Ropp , MS, OTR/L, CLT Pager: [phone]  12/23/2015, 10:08 AM

## 2016-01-04 ENCOUNTER — Encounter (HOSPITAL_COMMUNITY): Payer: Self-pay | Admitting: Orthopedic Surgery

## 2016-01-12 ENCOUNTER — Encounter (HOSPITAL_COMMUNITY): Payer: Self-pay | Admitting: Orthopedic Surgery

## 2016-01-24 NOTE — Discharge Summary (Signed)
Physician Discharge Summary  Patient ID: Alexandra Reese MRN: [id number] DOB/AGE: [birth year] 40 y.o.  Admit date: 12/22/2015 Discharge date: 01/24/2016  Admission Diagnoses:  DDD Cervical  Discharge Diagnoses:  Active Problems:   Neck pain   Past Medical History  Diagnosis Date  . Medical history non-contributory   . Boil, leg     right - MRSA- history of    Surgeries: Procedure(s): TOTAL DISC REPLACEMENT ARTHROPLASTY C3-4 on 12/22/2015   Consultants (if any):    Discharged Condition: Improved  Hospital Course: Alexandra Reese is an 40 y.o. female who was admitted 12/22/2015 with a diagnosis of Cervical DDD and went to the operating room on 12/22/2015 and underwent the above named procedures.  Pt was discharged on 12/23/15.   She was given perioperative antibiotics:  Anti-infectives    Start     Dose/Rate Route Frequency Ordered Stop   12/22/15 2330  ceFAZolin (ANCEF) IVPB 1 g/50 mL premix     1 g 100 mL/hr over 30 Minutes Intravenous Every 8 hours 12/22/15 2025 12/23/15 0717   12/22/15 1306  ceFAZolin (ANCEF) 2-4 GM/100ML-% IVPB    Comments:  Alexandra Reese   : cabinet override      12/22/15 1306 12/23/15 0114   12/22/15 1300  ceFAZolin (ANCEF) IVPB 2g/100 mL premix     2 g 200 mL/hr over 30 Minutes Intravenous 30 min pre-op 12/22/15 1300 12/22/15 1643    .  She was given sequential compression devices, early ambulation, and TED for DVT prophylaxis.  She benefited maximally from the hospital stay and there were no complications.    Recent vital signs:  Filed Vitals:   12/23/15 0527 12/23/15 0935  BP: 103/57 108/61  Pulse: 83 77  Temp: 98.6 F (37 C) 98.5 F (36.9 C)  Resp: 18 18    Recent laboratory studies:  Lab Results  Component Value Date   HGB 13.7 12/20/2015   Lab Results  Component Value Date   WBC 7.9 12/20/2015   PLT 274 12/20/2015   No results found for: INR No results found for: NA, K, CL, CO2, BUN, CREATININE, GLUCOSE  Discharge  Medications:     Medication List    STOP taking these medications        cyclobenzaprine 10 MG tablet  Commonly known as:  FLEXERIL     meloxicam 7.5 MG tablet  Commonly known as:  MOBIC     traMADol 50 MG tablet  Commonly known as:  ULTRAM      TAKE these medications        docusate sodium 100 MG capsule  Commonly known as:  COLACE  Take 1 capsule (100 mg total) by mouth 3 (three) times daily as needed for mild constipation.     methocarbamol 500 MG tablet  Commonly known as:  ROBAXIN  Take 1 tablet (500 mg total) by mouth 3 (three) times daily as needed for muscle spasms.     ondansetron 4 MG tablet  Commonly known as:  ZOFRAN  Take 1 tablet (4 mg total) by mouth every 8 (eight) hours as needed for nausea or vomiting.     oxyCODONE-acetaminophen 10-325 MG tablet  Commonly known as:  PERCOCET  Take 1 tablet by mouth every 4 (four) hours as needed for pain.        Diagnostic Studies: No results found.  Disposition: 01-Home or Self Care        Follow-up Information    Follow up with Alvy Beal, MD In 2  weeks.   Specialty:  Orthopedic Surgery   Why:  If symptoms worsen, For suture removal, For wound re-check   Contact information:   [address] [place] [id number] [phone]        Signed: Kirt BoysMayo, Aquita Simmering Christina 01/24/2016, 12:40 PM

## 2017-12-29 IMAGING — RF DG CERVICAL SPINE 2 OR 3 VIEWS
1 series · 2 of 2 positions shown · non-contrast
Comparison: None.

CLINICAL DATA: Patient for C3-C4 disc replacement.

EXAM:
CERVICAL SPINE - 2-3 VIEW; DG C-ARM 61-120 MIN

[Series 1: run · 2 of 2 slices shown]
[im 1/2]
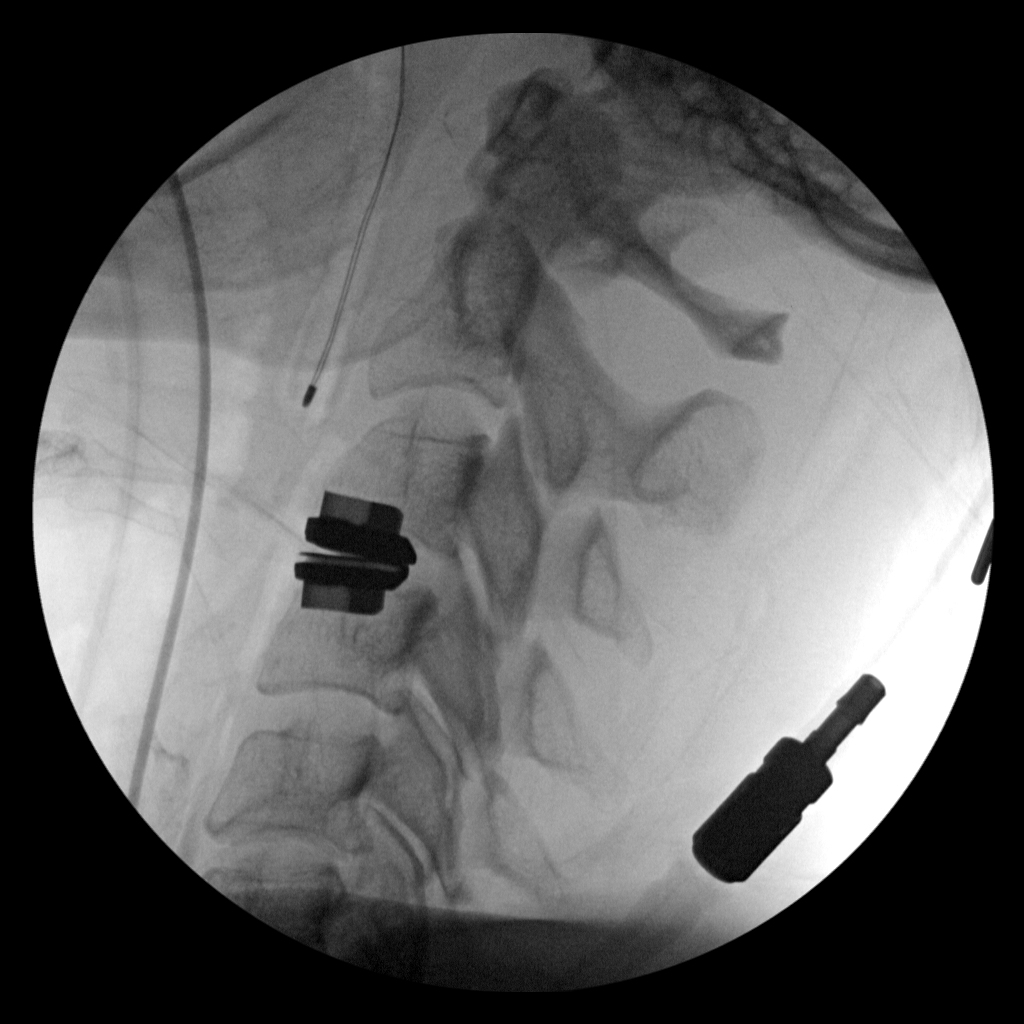
[im 2/2]
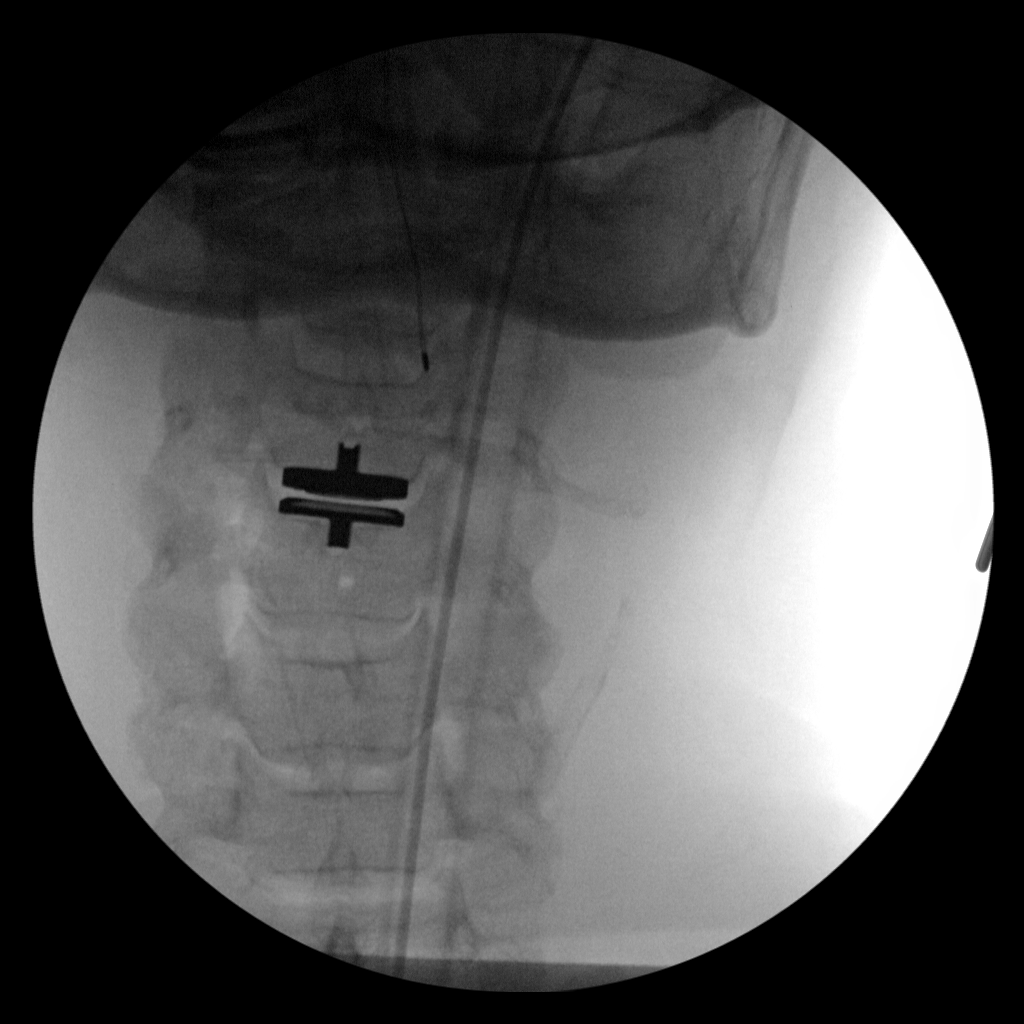

[2 of 2 positions shown; findings below may reference images not displayed]

FINDINGS: AP and lateral views of the cervical spine demonstrate disc
replacement hardware at the C3-4 level. Lines and tubes within the
prevertebral soft tissues.
IMPRESSION: Cervical disc hardware C3-4.

## 2017-12-29 IMAGING — CR DG CERVICAL SPINE 2 OR 3 VIEWS
2 series · 2 of 2 positions shown · non-contrast
Comparison: 12/22/2015

CLINICAL DATA: Postop from cervical spinal fusion.

EXAM:
CERVICAL SPINE - 2-3 VIEW

[xtable]
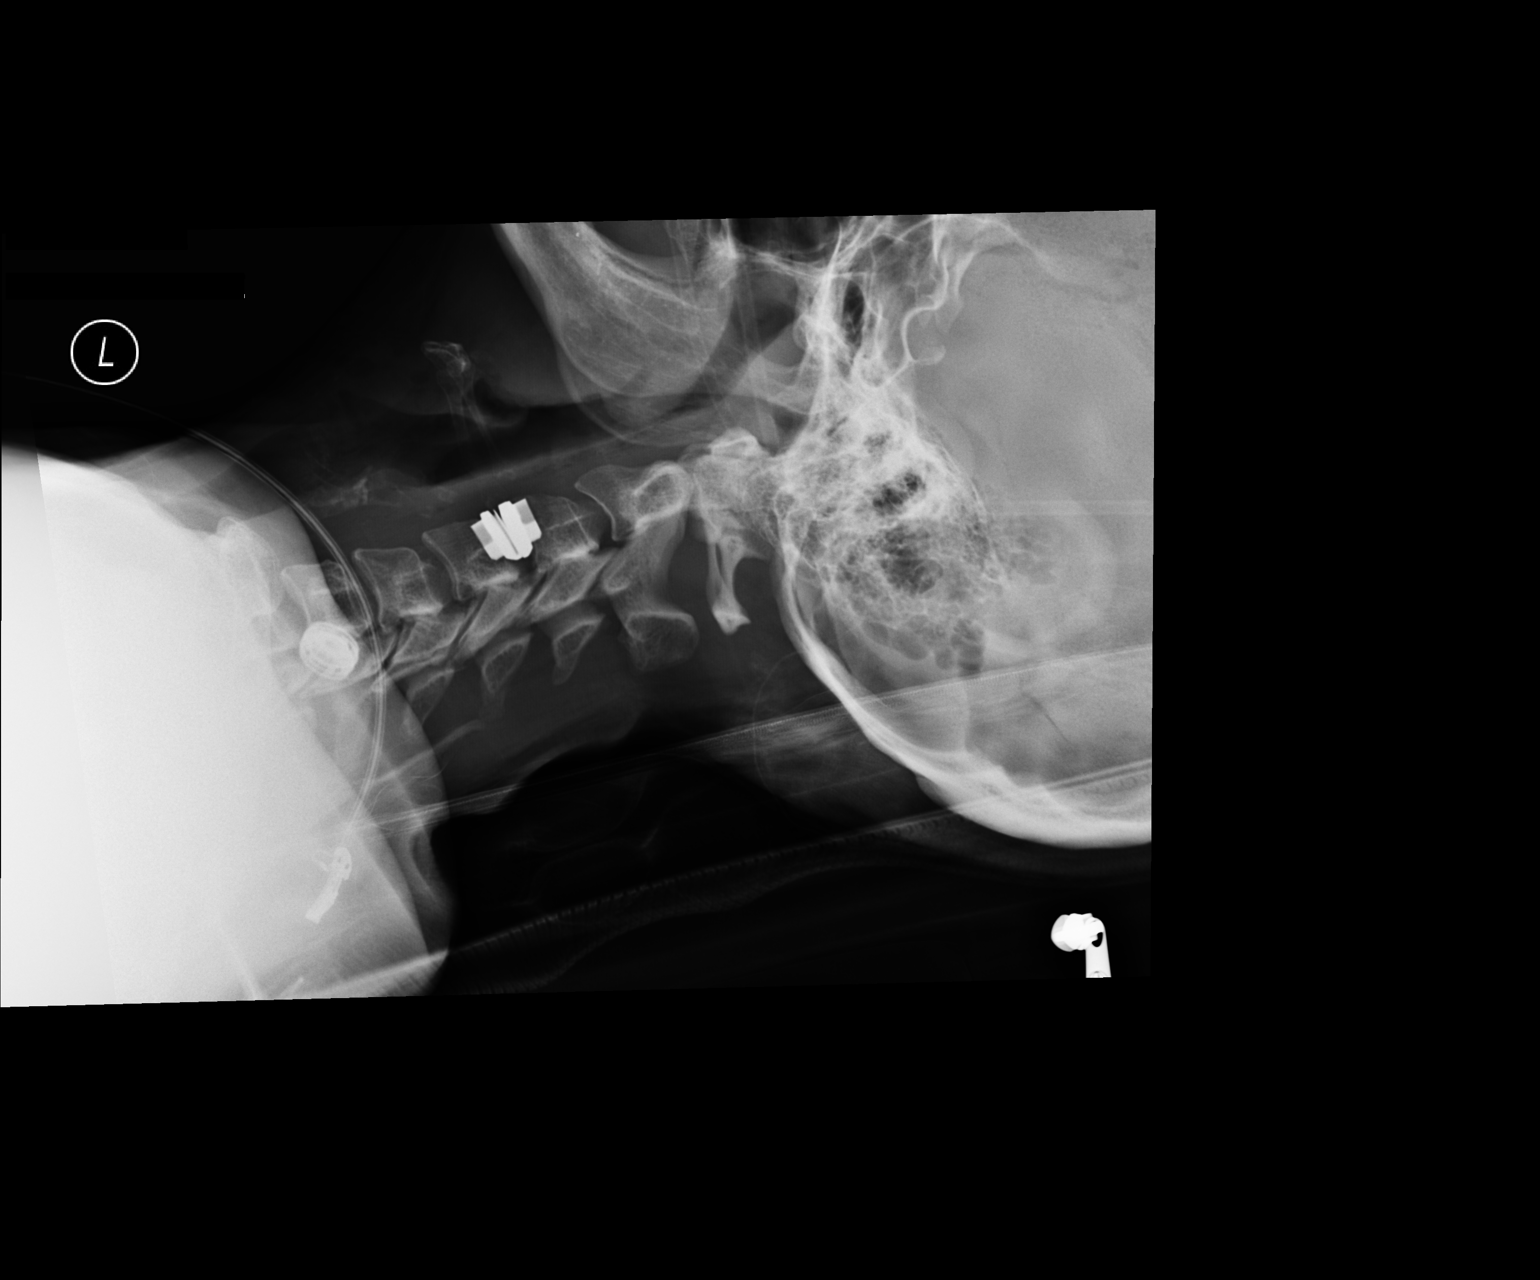

[AP]
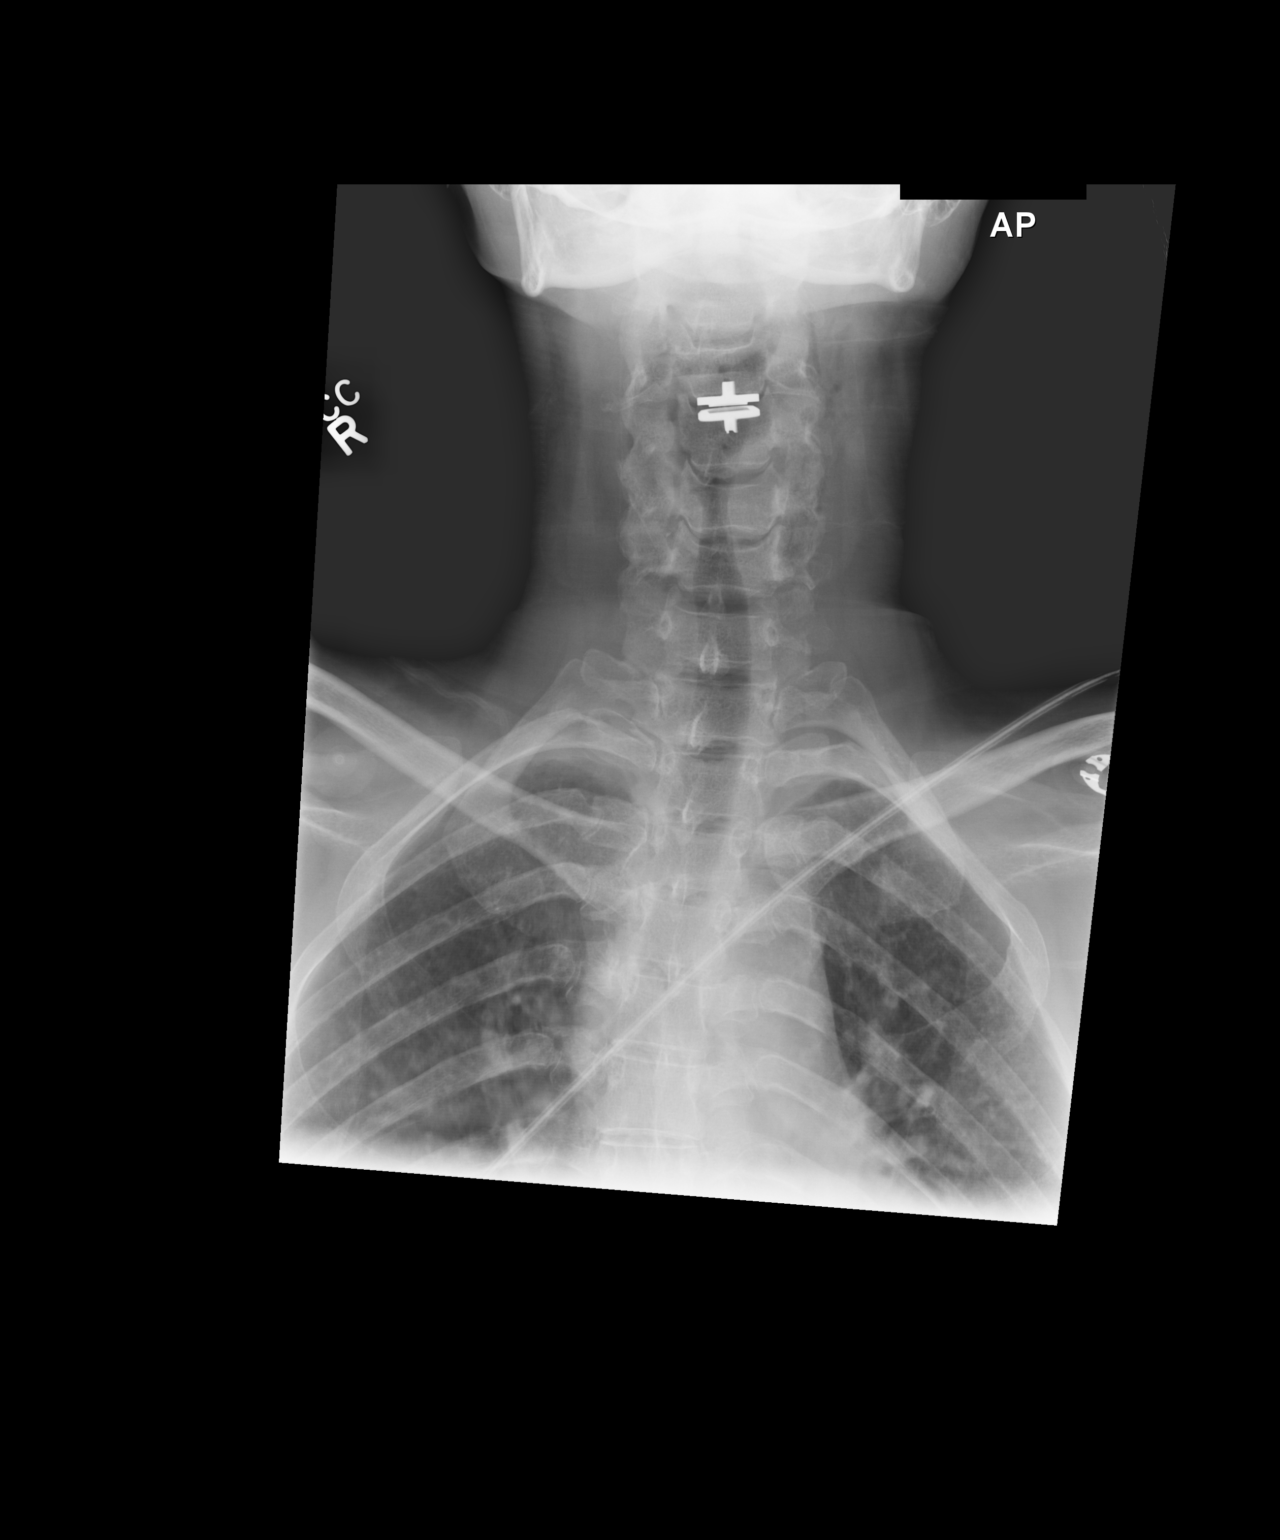

[2 of 2 positions shown; findings below may reference images not displayed]

FINDINGS: Interbody fusion hardware is seen at the level of C3-4. Normal
alignment demonstrated at this level.
IMPRESSION: Interbody fusion hardware at C3-4 with normal alignment.
